# Patient Record
Sex: Female | Born: 1975 | Race: White | Hispanic: No | Marital: Married | Smoking: Former smoker
Health system: Southern US, Community
[De-identification: ages and names within clinical notes are randomized; demographics above are authoritative.]

## PROBLEM LIST (undated history)

## (undated) HISTORY — PX: WISDOM TOOTH EXTRACTION: SHX21

---

## 2008-05-01 ENCOUNTER — Ambulatory Visit: Payer: Self-pay | Admitting: *Deleted

## 2008-05-01 ENCOUNTER — Inpatient Hospital Stay (HOSPITAL_COMMUNITY): Admission: AD | Admit: 2008-05-01 | Discharge: 2008-05-07 | Payer: Self-pay | Admitting: *Deleted

## 2008-06-05 ENCOUNTER — Other Ambulatory Visit (HOSPITAL_COMMUNITY): Admission: RE | Admit: 2008-06-05 | Discharge: 2008-06-26 | Payer: Self-pay | Admitting: Psychiatry

## 2008-06-06 ENCOUNTER — Ambulatory Visit: Payer: Self-pay | Admitting: Psychiatry

## 2010-04-24 LAB — COMPREHENSIVE METABOLIC PANEL
Albumin: 3.7 g/dL (ref 3.5–5.2)
Alkaline Phosphatase: 68 U/L (ref 39–117)
BUN: 7 mg/dL (ref 6–23)
CO2: 32 mEq/L (ref 19–32)
Chloride: 103 mEq/L (ref 96–112)
Glucose, Bld: 81 mg/dL (ref 70–99)
Potassium: 3.8 mEq/L (ref 3.5–5.1)
Total Bilirubin: 0.4 mg/dL (ref 0.3–1.2)

## 2010-04-24 LAB — CBC
HCT: 36 % (ref 36.0–46.0)
Hemoglobin: 12.8 g/dL (ref 12.0–15.0)
RBC: 3.98 MIL/uL (ref 3.87–5.11)
WBC: 11.3 10*3/uL — ABNORMAL HIGH (ref 4.0–10.5)

## 2010-04-24 LAB — URINE DRUGS OF ABUSE SCREEN W ALC, ROUTINE (REF LAB)
Amphetamine Screen, Ur: NEGATIVE
Barbiturate Quant, Ur: NEGATIVE
Creatinine,U: 35.1 mg/dL
Methadone: NEGATIVE
Propoxyphene: NEGATIVE

## 2010-04-24 LAB — URINALYSIS, ROUTINE W REFLEX MICROSCOPIC
Glucose, UA: NEGATIVE mg/dL
pH: 7 (ref 5.0–8.0)

## 2010-04-24 LAB — MAGNESIUM: Magnesium: 2.1 mg/dL (ref 1.5–2.5)

## 2010-04-24 LAB — TSH: TSH: 4.044 u[IU]/mL (ref 0.350–4.500)
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
## 2010-05-28 NOTE — H&P (Signed)
NAMEJERMIYA, REICHL NO.:  [id number]   MEDICAL RECORD NO.:  [id number]          PATIENT TYPE:  IPS   LOCATION:  0306                          FACILITY:  [facility]   PHYSICIAN:  Jasmine Pang, M.D. DATE OF BIRTH:  Jan 21, 1975   DATE OF ADMISSION:  05/01/2008  DATE OF DISCHARGE:                       PSYCHIATRIC ADMISSION ASSESSMENT   This 34 year old female voluntarily admitted on May 01, 2008.  The  patient reports that she has been depressed and having suicidal  thoughts, feeling very overwhelmed, feels her medications are not  working.  She had seen her therapist and referred the patient due to her  thinking of wanting to harm herself.  The patient has stress with her  family, taking care of children, working the night shift.  She states  that she feels depressed for some other day.  Feels like she has  thoughts of sinking.  She does drink on occasion.   PAST PSYCHIATRIC HISTORY:  This her first admission to the [facility].  She has a therapist named Granville Lewis.  She was just  hospitalized at [facility] years ago.   SOCIAL HISTORY:  A 35 year old female, married.  She has four children  ages 77, 13, 5, and 3.  [place]  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   FAMILY HISTORY:  None.   ALCOHOL AND DRUG HISTORY:  Reports some drinking recently.  Denies any  other substance use.   PRIMARY CARE Gracieann Stannard:  A family nurse practitioner in [place].   MEDICAL PROBLEMS:  Reports some recent elevated blood pressure readings.  No other known medical conditions.   MEDICATIONS:  She has been on:  1. Wellbutrin.  2. Cymbalta 60 mg b.i.d.   DRUG ALLERGIES:  NO KNOWN ALLERGIES.   REVIEW OF SYSTEMS:  Significant for insomnia, increased appetite.  The  remainder of systems are noncontributory.  Denies any chest pain,  shortness of breath, nausea, vomiting, diarrhea, muscle aches,  headache,  seizures.   PHYSICAL EXAMINATION:  This is a young female who is in no acute  distress.  Her temperature is 97.8, 93 heart rate, 20 respirations, blood pressure  is 140/93.  GENERAL APPEARANCE:  This is an overweight female who is no acute  distress.  HEAD:  Atraumatic and normocephalic.  Negative lymphadenopathy.  CHEST:  Clear.  BREAST EXAM:  Deferred.  HEART:  Rate is regular rate and rhythm.  There is no murmurs or gallops  auscultated.  ABDOMEN:  Soft, nondistended abdomen.  PELVIC/GU EXAM:  Deferred.  EXTREMITIES:  Moves all extremities.  No clubbing, no edema, 5+ against  resistance.  SKIN:  Warm and dry without rashes or lacerations noted.  NEUROLOGICAL:  Findings are intact and nonfocal.   LABORATORY DATA:  Shows a TSH of 4.044.  Magnesium of 2.1.  CMET within  normal limits.  Urinalysis negative.  Pregnancy test is negative.  The  urine drug screen is negative.  WBC count is 11.3.   MENTAL STATUS EXAM:  This is a fully alert, cooperative  female, casually  dressed.  She has fair eye contact.  She appears somewhat anxious and  depressed.  She gets a teary-eyed at times.  Her thought processes and  thought content are within normal limits.  Her judgment and insight are  good and present.   AXIS I:  Mood disorder.  AXIS II:  Deferred as she has no known medical conditions.  AXIS IV:  Other psychosocial problems, possible problems with  occupation.  AXIS V:  Current is 35-40.   PLAN:  To decrease Cymbalta as that may be adding to the patient's mood  instability.  We will add Abilify to augment her antidepressant to aid  with mood stabilization.  The patient will be advised to continue with  her individual therapy.  We will consider a family session with her  husband for support concerns.  We will also address her alcohol use.  Her tentative length of stay at this time is 3-4 days.      Landry Corporal, N.P.      Jasmine Pang, M.D.   Electronically Signed    JO/MEDQ  D:  05/03/2008  T:  05/03/2008  Job:  [id number]

## 2016-08-06 ENCOUNTER — Other Ambulatory Visit: Payer: Self-pay | Admitting: Orthopedic Surgery

## 2016-08-06 DIAGNOSIS — M5412 Radiculopathy, cervical region: Secondary | ICD-10-CM

## 2016-08-13 ENCOUNTER — Encounter: Payer: Self-pay | Admitting: Radiology

## 2016-08-13 ENCOUNTER — Ambulatory Visit
Admission: RE | Admit: 2016-08-13 | Discharge: 2016-08-13 | Disposition: A | Discharge status: Home / Self Care | Payer: Managed Care, Other (non HMO) | Source: Ambulatory Visit | Attending: Orthopedic Surgery | Admitting: Orthopedic Surgery

## 2016-08-13 DIAGNOSIS — M5412 Radiculopathy, cervical region: Secondary | ICD-10-CM

## 2016-08-13 MED ORDER — TRIAMCINOLONE ACETONIDE 40 MG/ML IJ SUSP (RADIOLOGY)
60.0000 mg | Freq: Once | INTRAMUSCULAR | Status: AC
  Administered 2016-08-13: 60 mg via EPIDURAL

## 2016-08-13 MED ORDER — IOPAMIDOL (ISOVUE-M 300) INJECTION 61%
1.0000 mL | Freq: Once | INTRAMUSCULAR | Status: AC | PRN
  Administered 2016-08-13: 1 mL via EPIDURAL

## 2016-08-13 NOTE — Discharge Instructions (Signed)

## 2016-08-18 ENCOUNTER — Other Ambulatory Visit: Payer: Self-pay

## 2017-10-14 DIAGNOSIS — F4312 Post-traumatic stress disorder, chronic: Secondary | ICD-10-CM | POA: Insufficient documentation

## 2017-10-14 DIAGNOSIS — F3181 Bipolar II disorder: Secondary | ICD-10-CM

## 2017-10-19 ENCOUNTER — Encounter: Payer: Self-pay | Admitting: Psychiatry

## 2017-10-19 ENCOUNTER — Ambulatory Visit (INDEPENDENT_AMBULATORY_CARE_PROVIDER_SITE_OTHER): Payer: 59 | Admitting: Psychiatry

## 2017-10-19 VITALS — BP 137/87 | HR 78 | Ht 66.0 in

## 2017-10-19 DIAGNOSIS — F3181 Bipolar II disorder: Secondary | ICD-10-CM

## 2017-10-19 DIAGNOSIS — F5101 Primary insomnia: Secondary | ICD-10-CM

## 2017-10-19 DIAGNOSIS — F988 Other specified behavioral and emotional disorders with onset usually occurring in childhood and adolescence: Secondary | ICD-10-CM

## 2017-10-19 DIAGNOSIS — F4010 Social phobia, unspecified: Secondary | ICD-10-CM | POA: Diagnosis not present

## 2017-10-19 MED ORDER — LISDEXAMFETAMINE DIMESYLATE 60 MG PO CAPS: 60.0000 mg | ORAL_CAPSULE | ORAL | 0 refills | Status: DC

## 2017-10-19 MED ORDER — TRAZODONE 25 MG HALF TABLET: 100.0000 mg | ORAL_TABLET | Freq: Every evening | ORAL | Status: DC | PRN

## 2017-10-19 MED ORDER — OXCARBAZEPINE 300 MG PO TABS: ORAL_TABLET | ORAL | 1 refills | Status: DC

## 2017-10-19 MED ORDER — LAMOTRIGINE 200 MG PO TABS: 200.0000 mg | ORAL_TABLET | Freq: Every day | ORAL | 1 refills | Status: AC

## 2017-10-19 NOTE — Progress Notes (Signed)
Tricia Barnett [id number] 1975/02/12 42 y.o.  Subjective:   Patient ID:  Tricia Barnett is a 42 y.o. (DOB Jan 21, 1975) female.  Chief Complaint:  Chief Complaint  Patient presents with  . Anxiety  . Depression    HPI [name] presents to the office today for follow-up of anxiety, mood, and sleep disturbance. She reports that she took about a week off work to get adequate sleep. She reports that she typically does not sleep before work or immediately after work. Reports anticipatory anxiety before going into work. Reports that she has decreased caffeine use to improve sleep. Reports that her sleep issues returned after she returned to work. Working 3-4 days a week. Reports that her mood and anxiety improved after getting adequate sleep. Reports that her mood was "horrific" prior to getting adequate sleep and had SI. Reports that she has not had any recent suicidal thoughts. Mood continues to be depressed but has been improving. She reports that others would say she is irritable and she reports that her irritability is at baseline. Denies panic attacks over the last few weeks. Conitnues to avoid social situations and crowds. Was able to go out to eat with a friend recently but went very early before restaurant was crowded. Reports worry and anxious thoughts are at baseline.   Reports Trileptal does not help with sleep. Reports that she has been taking 150 mg po q am and 300 mg po QHS and she reports that this has helped with anxiety. She reports recent binge eating and that this has been increased. No change in energy or motivation. She reports that energy and motivation was very low 2 weeks ago. No change in concentration. Reports that she may want to consider changing Vyvanse in the future but not at this time since s/s are more stable.   Medications: I have reviewed the patient's current medications.  Allergies: No Known Allergies  History reviewed. No pertinent past medical  history.  Past Surgical History:  Procedure Laterality Date  . CESAREAN SECTION    . WISDOM TOOTH EXTRACTION      Family History  Problem Relation Age of Onset  . Depression Mother   . Anxiety disorder Mother   . Insulin resistance Mother   . Mood Disorder Father   . Diabetes Father   . Hypertension Father   . Kidney cancer Father   . Alcoholism Paternal Grandfather   . Mood Disorder Paternal Uncle   . Mood Disorder Paternal Aunt     Social History   Socioeconomic History  . Marital status: Married    Spouse name: Not on file  . Number of children: 4  . Years of education: Not on file  . Highest education level: Associate degree: occupational, Hotel manager, or vocational program  Occupational History  . Occupation: Nurse  Social Needs  . Financial resource strain: Not on file  . Food insecurity:    Worry: Not on file    Inability: Not on file  . Transportation needs:    Medical: Not on file    Non-medical: Not on file  Tobacco Use  . Smoking status: Former Research scientist (life sciences)  . Smokeless tobacco: Never Used  Substance and Sexual Activity  . Alcohol use: Yes    Comment: Reports that she has been drinking more vodka, about 6 out of 7 days. Reports ETOH use has been less in the last week.  . Drug use: Not Currently  . Sexual activity: Not on file  Lifestyle  .  Physical activity:    Days per week: Not on file    Minutes per session: Not on file  . Stress: Not on file  Relationships  . Social connections:    Talks on phone: Not on file    Gets together: Not on file    Attends religious service: Not on file    Active member of club or organization: Not on file    Attends meetings of clubs or organizations: Not on file    Relationship status: Not on file  . Intimate partner violence:    Fear of current or ex partner: Not on file    Emotionally abused: Not on file    Physically abused: Not on file    Forced sexual activity: Not on file  Other Topics Concern  . Not on file   Social History Narrative  . Not on file    Past Medical History, Surgical history, Social history, and Family history were reviewed and updated as appropriate.   Please see review of systems for further details on the patient's review from today.   Review of Systems:  Review of Systems  Respiratory: Negative.   Gastrointestinal: Positive for constipation.  Genitourinary: Positive for vaginal bleeding.       Reports that she has been having heavy menstrual periods.   Musculoskeletal: Negative for gait problem.  Neurological: Negative for tremors.  Psychiatric/Behavioral: Positive for decreased concentration and dysphoric mood. The patient is nervous/anxious.     Objective:   Physical Exam:  BP 137/87   Pulse 78   Ht '5\' 6"'$  (1.676 m)   Physical Exam  Constitutional: She is oriented to person, place, and time. She appears well-developed.  Musculoskeletal: She exhibits no deformity.  Neurological: She is alert and oriented to person, place, and time.  Psychiatric: Her speech is normal and behavior is normal. Judgment and thought content normal. Her mood appears anxious. Cognition and memory are normal. She exhibits a depressed mood. She expresses no homicidal and no suicidal ideation. She expresses no suicidal plans and no homicidal plans.  Pt presents as less depressed and anxious compared to recent past exams.     Lab Review:     Component Value Date/Time   NA 140 05/01/2008 2106   K 3.8 05/01/2008 2106   CL 103 05/01/2008 2106   CO2 32 05/01/2008 2106   GLUCOSE 81 05/01/2008 2106   BUN 7 05/01/2008 2106   CREATININE 0.69 05/01/2008 2106   CALCIUM 9.9 05/01/2008 2106   PROT 6.9 05/01/2008 2106   ALBUMIN 3.7 05/01/2008 2106   AST 26 05/01/2008 2106   ALT 18 05/01/2008 2106   ALKPHOS 68 05/01/2008 2106   BILITOT 0.4 05/01/2008 2106   GFRNONAA >60 05/01/2008 2106   GFRAA  05/01/2008 2106    >60        The eGFR has been calculated using the MDRD equation. This  calculation has not been validated in all clinical situations. eGFR's persistently <60 mL/min signify possible Chronic Kidney Disease.       Component Value Date/Time   WBC 11.3 (H) 05/01/2008 2106   RBC 3.98 05/01/2008 2106   HGB 12.8 05/01/2008 2106   HCT 36.0 05/01/2008 2106   PLT 291 05/01/2008 2106   MCV 90.4 05/01/2008 2106   MCHC 35.4 05/01/2008 2106   RDW 12.4 05/01/2008 2106     Assessment: Plan:   Patient seen for 30 minutes and greater than 50% of visit spent counseling patient regarding plan of care,  to include restarting trazodone as needed for insomnia since patient reports that this may have been effective in the past.  Also discussed treatment for attention deficit and patient questions if another medication besides Vyvanse may be more effective for her concentration, however she reports that she does not want to change Vyvanse at this time since overall the majority of her signs and symptoms are more stable at present.  Discussed considering a methylphenidate based stimulant in the future since patient has not taken any of these types of medications in the past and methylphenidate based medications may be less likely to exacerbate anxiety and irritability.  Recommend patient continue psychotherapy with Lina Sayre, LPC.  Patient to follow-up with this provider in 3 months or sooner if clinically indicated. Bipolar II disorder ([place]) - Plan: Oxcarbazepine (TRILEPTAL) 300 MG tablet, lamoTRIgine (LAMICTAL) 200 MG tablet  Social anxiety disorder  Attention deficit disorder (ADD) without hyperactivity - Plan: lisdexamfetamine (VYVANSE) 60 MG capsule  Primary insomnia - Plan: traZODone (DESYREL) tablet 100 mg  Please see After Visit Summary for patient specific instructions.  Future Appointments  Date Time Provider [place]  10/23/2017 10:00 AM Lina Sayre, [facility] CP-[facility] None  01/25/2018  1:00 PM Thayer Headings, PMHNP CP-CP None    No orders of the defined  types were placed in this encounter.     -------------------------------

## 2017-10-20 ENCOUNTER — Encounter: Payer: Self-pay | Admitting: Psychiatry

## 2017-10-21 ENCOUNTER — Other Ambulatory Visit: Payer: Self-pay

## 2017-10-21 ENCOUNTER — Ambulatory Visit: Payer: Self-pay | Admitting: Psychiatry

## 2017-10-21 MED ORDER — TRAZODONE HCL 100 MG PO TABS: ORAL_TABLET | ORAL | 0 refills | Status: DC

## 2017-10-23 ENCOUNTER — Ambulatory Visit (INDEPENDENT_AMBULATORY_CARE_PROVIDER_SITE_OTHER): Payer: 59 | Admitting: Psychiatry

## 2017-10-23 DIAGNOSIS — F4312 Post-traumatic stress disorder, chronic: Secondary | ICD-10-CM | POA: Diagnosis not present
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
## 2017-10-27 ENCOUNTER — Ambulatory Visit (INDEPENDENT_AMBULATORY_CARE_PROVIDER_SITE_OTHER): Payer: 59 | Admitting: Psychiatry

## 2017-10-27 DIAGNOSIS — F4312 Post-traumatic stress disorder, chronic: Secondary | ICD-10-CM | POA: Diagnosis not present

## 2017-10-27 NOTE — Progress Notes (Signed)
      [facility] Counselor/Therapist Progress Note   Patient ID: Tricia Barnett, MRN: [id number]  Date: 10/27/2017  Timespent: 45 minutes  Treatment Type: Individual  Subjective: Patient was present for session.  Patient shared that she has been struggling recently with nightmares and flashbacks.  Patient went on to explain lots of the memories have created behaviors that she is very ashamed of.  Patient wanted to share some of the recent flashbacks in session.  Patient was allowed time to do that and encouraged to remind herself currently she is safe and that she does not have to go through those traumas any longer.  Patient stated that it is hard for her to connect with the younger parts of her because they were so damaged.  Patient was encouraged just to work on the self talk and take it one step at a time but to remind herself regularly that she is enough and she is safe.  Patient did a E MDR container exercise which she is to use to put all of the memories that are disturbing her and get overwhelming.  It was agreed 1 memory at a time would be addressed in treatment at a time.  Patient also discussed being at home is making her think more so she decided she may want to pick up more shifts at work.  Patient was encouraged to try and do things that are concrete like decorating and cleaning and not let her brain wander in lots of different directions but to be engaged in concrete activity like work.  Patient agreed to work on that over the next week.  Interventions:Solution Focused, Supportive and Other: EMDR  Mental Status Exam:   Appearance:   Well Groomed     Behavior:  Appropriate  Motor:  Normal  Speech/Language:   Normal Rate  Affect:  Full Range  Mood:  anxious  Thought process:  Intact  Thought content:    Logical  Perceptual disturbances:    Normal  Orientation:  Full (Time, Place, and Person)  Attention:  Good  Concentration:  good  Memory:  Immediate  Fund of knowledge:    Good  Insight:    Good  Judgment:   Good  Impulse Control:  good    Reported Symptoms: anxiety, flashbacks, nightmares, anger, hurt  Risk Assessment: Danger to Self:  No Self-injurious Behavior: No Danger to Others: No Duty to Warn:no Physical Aggression / Violence:No  Access to Firearms a concern: No  Gang Involvement:No   Diagnosis:   ICD-10-CM   1. 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  Patient to continue to engage in individual counseling 2-4 times a month or as needed. 2.  Patient to identify and apply CBT, coping skills learned in session to decrease triggered memories and anxiety symptoms. 3.  Patient to contact this office, go to the local ED or call 911 if a crisis or emergency develops between visits.  Stevphen Meuse, [place]

## 2017-11-06 ENCOUNTER — Ambulatory Visit (INDEPENDENT_AMBULATORY_CARE_PROVIDER_SITE_OTHER): Payer: 59 | Admitting: Psychiatry

## 2017-11-06 DIAGNOSIS — F4312 Post-traumatic stress disorder, chronic: Secondary | ICD-10-CM | POA: Diagnosis not present

## 2017-11-06 DIAGNOSIS — F3181 Bipolar II disorder: Secondary | ICD-10-CM
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
## 2017-11-10 ENCOUNTER — Ambulatory Visit (INDEPENDENT_AMBULATORY_CARE_PROVIDER_SITE_OTHER): Payer: 59 | Admitting: Psychiatry

## 2017-11-10 DIAGNOSIS — F3181 Bipolar II disorder: Secondary | ICD-10-CM | POA: Diagnosis not present

## 2017-11-10 DIAGNOSIS — F4310 Post-traumatic stress disorder, unspecified: Secondary | ICD-10-CM
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
## 2017-11-19 ENCOUNTER — Ambulatory Visit (INDEPENDENT_AMBULATORY_CARE_PROVIDER_SITE_OTHER): Payer: 59 | Admitting: Psychiatry

## 2017-11-19 DIAGNOSIS — F4310 Post-traumatic stress disorder, unspecified: Secondary | ICD-10-CM

## 2017-11-19 DIAGNOSIS — F3181 Bipolar II disorder: Secondary | ICD-10-CM
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
## 2017-11-24 ENCOUNTER — Ambulatory Visit (INDEPENDENT_AMBULATORY_CARE_PROVIDER_SITE_OTHER): Payer: 59 | Admitting: Psychiatry

## 2017-11-24 DIAGNOSIS — F4310 Post-traumatic stress disorder, unspecified: Secondary | ICD-10-CM | POA: Diagnosis not present

## 2017-11-24 DIAGNOSIS — F3181 Bipolar II disorder: Secondary | ICD-10-CM
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
## 2017-11-30 ENCOUNTER — Ambulatory Visit: Payer: 59 | Admitting: Psychiatry

## 2017-11-30 DIAGNOSIS — F4312 Post-traumatic stress disorder, chronic: Secondary | ICD-10-CM | POA: Diagnosis not present

## 2017-11-30 DIAGNOSIS — F3181 Bipolar II disorder: Secondary | ICD-10-CM
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
## 2017-12-08 ENCOUNTER — Other Ambulatory Visit: Payer: Self-pay | Admitting: Psychiatry

## 2017-12-08 ENCOUNTER — Ambulatory Visit (INDEPENDENT_AMBULATORY_CARE_PROVIDER_SITE_OTHER): Payer: 59 | Admitting: Psychiatry

## 2017-12-08 DIAGNOSIS — F3181 Bipolar II disorder: Secondary | ICD-10-CM

## 2017-12-08 DIAGNOSIS — F4312 Post-traumatic stress disorder, chronic: Secondary | ICD-10-CM

## 2017-12-08 MED ORDER — OXCARBAZEPINE 300 MG PO TABS: ORAL_TABLET | ORAL | 1 refills | Status: AC

## 2017-12-08 NOTE — Progress Notes (Signed)
Please let pt or her husband know that Trileptal was resent. Recommend re-starting prior to trip since this may help prevent worsening mood, anxiety, or insomnia while traveling. It also helps to prevent seizures and ETOH withdrawal s/s. Recommend gradually re-starting it, ie. 150 mg QHS x 3 days, then 300 mg po QHS, and then 1/2 q am and 300 mg QHS

## 2017-12-08 NOTE — Progress Notes (Signed)
      [facility] Counselor/Therapist Progress Note  Patient ID: Tricia Barnett, MRN: [id number],    Date: 12/08/2017  Time Spent: 51 minutes  Treatment Type: Individual Therapy  Reported Symptoms: Depressed mood and Anxious Mood,  Mental Status Exam:  Appearance:   Well Groomed     Behavior:  Appropriate  Motor:  Normal  Speech/Language:   Normal Rate  Affect:  Appropriate  Mood:  anxious  Thought process:  circumstantial  Thought content:    WNL  Sensory/Perceptual disturbances:    WNL  Orientation:  oriented to person, place and time/date  Attention:  Fair  Concentration:  Fair  Memory:  Immediate;   Fair  [employer] of knowledge:   Good  Insight:    Fair  Judgment:   Fair  Impulse Control:  Fair   Risk Assessment: Danger to Self:  No Self-injurious Behavior: No Danger to Others: No Duty to Warn:no Physical Aggression / Violence:No  Access to Firearms a concern: No  Gang Involvement:No   Subjective: Patient was present for session.  Her husband sat in on the first part of session.  They reported they had gotten a call from school concerning their daughter and questions about her safety.  They were given a referral to contact concerning meeting with her daughter.  Patient spent time developing a plan for her to get through her trip to [place] that is coming up in a week.  Patient explained she feels very anxious and overwhelmed because many people that she does not know will be going on the trip.  Ways to start preparing herself cognitively were discussed in session.  Patient was reminded of the importance of keeping her brain engaged in positive so that it does not go in a negative direction.  Patient acknowledged her mother-in-law will be going with her which is a very positive thing.  She had a negative interaction with her mother concerning the trip.  Ways to disconnect from that situation were discussed with patient.  Patient shared that she had run out of 1 of her  medications and never  chose to restart it.  Let her know that that information will be passed on to Corie ChiquitoJessica [facility] NP her provider.  Interventions: Solution-Oriented/Positive Psychology  Diagnosis:   ICD-10-CM   1. 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 Bipolar II disorder (HCC) F31.81     Plan: 1.  Patient to continue to engage in individual counseling 2-4 times a month or as needed. 2.  Patient to identify and apply CBT, coping skills learned in session to decrease depression and anxiety symptoms. 3.  Patient to contact this office, go to the local ED or call 911 if a crisis or emergency develops between visits.  Stevphen MeuseHolly Alexsia Klindt, [place]

## 2017-12-14 ENCOUNTER — Encounter: Payer: Self-pay | Admitting: Psychiatry

## 2017-12-14 ENCOUNTER — Ambulatory Visit (INDEPENDENT_AMBULATORY_CARE_PROVIDER_SITE_OTHER): Payer: 59 | Admitting: Psychiatry

## 2017-12-14 DIAGNOSIS — F4312 Post-traumatic stress disorder, chronic: Secondary | ICD-10-CM | POA: Diagnosis not present

## 2017-12-14 DIAGNOSIS — F3181 Bipolar II disorder: Secondary | ICD-10-CM

## 2017-12-14 NOTE — Progress Notes (Signed)
      [facility] Counselor/Therapist Progress Note  Patient ID: Tricia Barnett, MRN: [id number],    Date: 12/15/2017  Time Spent: 52 minutes   Treatment Type: Individual Therapy  Reported Symptoms: Anxious Mood and Panic Attacks  Mental Status Exam:  Appearance:   Well Groomed     Behavior:  Appropriate  Motor:  Restlestness  Speech/Language:   Normal Rate  Affect:  Congruent  Mood:  anxious  Thought process:  circumstantial  Thought content:    WNL  Sensory/Perceptual disturbances:    WNL  Orientation:  oriented to person, place and time/date  Attention:  Good  Concentration:  Good  Memory:  WNL  Fund of knowledge:   Good  Insight:    Fair  Judgment:   Fair  Impulse Control:  Fair   Risk Assessment: Danger to Self:  No Self-injurious Behavior: No Danger to Others: No Duty to Warn:no Physical Aggression / Violence:No  Access to Firearms a concern: No  Gang Involvement:No   Subjective: Patient was present for session.  She reported feeling lots of anxiety over her trip.  She explained she is finding herself obsessing of her little details.  Patient was encouraged to think through ways to keep her self at a good place during the trip.  She was reminded that thoughts lead to feelings lead to behaviors so she has to focus on her thoughts.  Patient was encouraged to recognize things that trigger her.  Discussed the importance of using grounding techniques basis to make sure she functioning appropriately.  Different strategies to engage her brain and concrete things were discussed with patient as developed.  Patient was also able to think through safe people that will be on the trip with her to spend time with so she can feel more comfortable.  Patient agreed to take all of her medication as directed on her trip as well.  Patient reported feeling good about the plans and end of session.  Interventions: Solution-Oriented/Positive Psychology  Diagnosis:   ICD-10-CM   1.  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 Bipolar II disorder (HCC) F31.81     Plan: 1.  Patient to continue to engage in individual counseling 2-4 times a month or as needed. 2.  Patient to identify and apply CBT, coping skills learned in treatment to decrease triggered responses and anxiety symptoms. 3.  Patient to contact this office, go to the local ED or call 911 if a crisis or emergency develops between visits.  Stevphen MeuseHolly Phillippa Straub, [place]

## 2018-01-20 ENCOUNTER — Ambulatory Visit: Payer: 59 | Admitting: Psychiatry

## 2018-01-25 ENCOUNTER — Encounter: Payer: Self-pay | Admitting: Psychiatry

## 2018-01-25 ENCOUNTER — Ambulatory Visit (INDEPENDENT_AMBULATORY_CARE_PROVIDER_SITE_OTHER): Payer: 59 | Admitting: Psychiatry

## 2018-01-25 VITALS — BP 121/83 | HR 59

## 2018-01-25 DIAGNOSIS — F4310 Post-traumatic stress disorder, unspecified: Secondary | ICD-10-CM

## 2018-01-25 DIAGNOSIS — F5101 Primary insomnia: Secondary | ICD-10-CM | POA: Diagnosis not present

## 2018-01-25 DIAGNOSIS — F988 Other specified behavioral and emotional disorders with onset usually occurring in childhood and adolescence: Secondary | ICD-10-CM

## 2018-01-25 DIAGNOSIS — F4010 Social phobia, unspecified: Secondary | ICD-10-CM

## 2018-01-25 MED ORDER — TRAZODONE HCL 100 MG PO TABS: ORAL_TABLET | ORAL | 1 refills | Status: AC

## 2018-01-25 MED ORDER — LISDEXAMFETAMINE DIMESYLATE 60 MG PO CAPS: 60.0000 mg | ORAL_CAPSULE | ORAL | 0 refills | Status: DC

## 2018-01-25 MED ORDER — LISDEXAMFETAMINE DIMESYLATE 60 MG PO CAPS: 60.0000 mg | ORAL_CAPSULE | Freq: Every morning | ORAL | 0 refills | Status: AC

## 2018-01-25 MED ORDER — LISDEXAMFETAMINE DIMESYLATE 60 MG PO CAPS: 60.0000 mg | ORAL_CAPSULE | ORAL | 0 refills | Status: AC

## 2018-01-25 NOTE — Progress Notes (Signed)
Tricia Barnett [id number] 1975-10-18 43 y.o.  Subjective:   Patient ID:  Tricia Barnett is a 43 y.o. (DOB 03-05-1975) female.  Chief Complaint:  Chief Complaint  Patient presents with  . Follow-up    h/o Anxiety, Depression, ADD, Sleep disturbance    HPI Tricia Barnett presents to the office today for follow-up of Mood, anxiety, and insomnia. She is accompanied by her husband. She reports "I'm good." She and her husband report that her mood and anxiety is the best it has been in years. She reports that trip to [place] was helpful and she has a different perspective and has grown in her faith. Has not had ETOH in 28 days. She denies any physical cravings for ETOH. Reports occasional psychological cravings. Reports that her mood has been more stable with occasional mild depression. Denies any impulsive or risky behavior. Reports that she continues to experience some anxiety. Some social anxiety and worries about what people think about her. Had severe anxiety on Saturday in anticipation of going to a gathering with the in-laws. 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 Reports that her sleep is "ok" and that Trazodone is effective. Reports that at times she will not take Trazodone if she feels like can fall asleep without it. Appetite has been "ok." Reports that she has had some increase in binge eating since she stopped drinking. She reports that concentration is "not that great but I know Vyvanse helps because I had a few days without it" and that it is severely impaired when she does not take Vyvanse. Energy and motivation have ben good. Denies SI. Reports vague thoughts/fears about what could happen if she drank excessively.    Review of Systems:  Review of Systems  Musculoskeletal: Negative for gait problem.  Neurological: Negative for tremors.  Psychiatric/Behavioral:       Please refer to HPI    Medications: I have reviewed the patient's current  medications.  Current Outpatient Medications  Medication Sig Dispense Refill  . ibuprofen (ADVIL,MOTRIN) 400 MG tablet Take 400 mg by mouth every 6 (six) hours as needed.    Derrill Memo ON 02/22/2018] lisdexamfetamine (VYVANSE) 60 MG capsule Take 1 capsule (60 mg total) by mouth every morning. 30 capsule 0  . lisdexamfetamine (VYVANSE) 60 MG capsule Take 1 capsule (60 mg total) by mouth every morning for 30 days. 30 capsule 0  . propranolol (INDERAL) 10 MG tablet Take 10 mg by mouth 2 times daily at 12 noon and 4 pm.    . traZODone (DESYREL) 100 MG tablet Take 1-2 tablets at bedtime as needed for sleep 180 tablet 1  . lamoTRIgine (LAMICTAL) 200 MG tablet Take 200 mg by mouth daily.    Marland Kitchen lamoTRIgine (LAMICTAL) 200 MG tablet Take 1 tablet (200 mg total) by mouth daily. (Patient not taking: Reported on 01/25/2018) 90 tablet 1  . [START ON 03/22/2018] lisdexamfetamine (VYVANSE) 60 MG capsule Take 1 capsule (60 mg total) by mouth every morning. 30 capsule 0  . Oxcarbazepine (TRILEPTAL) 300 MG tablet Take 300 mg by mouth at bedtime. 0.5 TAB PO Q AM AND 1 TAB AT BEDTIME    . Oxcarbazepine (TRILEPTAL) 300 MG tablet Take 1/2 tab po q am and 1 tab po qhs (Patient not taking: Reported on 01/25/2018) 135 tablet 1  . pramipexole (MIRAPEX) 0.5 MG tablet Take 0.5 mg by mouth 2 (two) times daily.     No current facility-administered medications for this visit.  Medication Side Effects: None  Allergies: No Known Allergies  History reviewed. No pertinent past medical history.  Family History  Problem Relation Age of Onset  . Depression Mother   . Anxiety disorder Mother   . Insulin resistance Mother   . Mood Disorder Father   . Diabetes Father   . Hypertension Father   . Kidney cancer Father   . Alcoholism Paternal Grandfather   . Mood Disorder Paternal Uncle   . Mood Disorder Paternal Aunt     Social History   Socioeconomic History  . Marital status: Married    Spouse name: Not on file  .  Number of children: 4  . Years of education: Not on file  . Highest education level: Associate degree: occupational, Hotel manager, or vocational program  Occupational History  . Occupation: Nurse  Social Needs  . Financial resource strain: Not on file  . Food insecurity:    Worry: Not on file    Inability: Not on file  . Transportation needs:    Medical: Not on file    Non-medical: Not on file  Tobacco Use  . Smoking status: Former Research scientist (life sciences)  . Smokeless tobacco: Never Used  Substance and Sexual Activity  . Alcohol use: Yes    Comment: Reports that she has been drinking more vodka, about 6 out of 7 days. Reports ETOH use has been less in the last week.  . Drug use: Not Currently  . Sexual activity: Not on file  Lifestyle  . Physical activity:    Days per week: Not on file    Minutes per session: Not on file  . Stress: Not on file  Relationships  . Social connections:    Talks on phone: Not on file    Gets together: Not on file    Attends religious service: Not on file    Active member of club or organization: Not on file    Attends meetings of clubs or organizations: Not on file    Relationship status: Not on file  . Intimate partner violence:    Fear of current or ex partner: Not on file    Emotionally abused: Not on file    Physically abused: Not on file    Forced sexual activity: Not on file  Other Topics Concern  . Not on file  Social History Narrative  . Not on file    Past Medical History, Surgical history, Social history, and Family history were reviewed and updated as appropriate.   Please see review of systems for further details on the patient's review from today.   Objective:   Physical Exam:  BP 121/83   Pulse (!) 59   Physical Exam Constitutional:      General: She is not in acute distress.    Appearance: She is well-developed.  Musculoskeletal:        General: No deformity.  Neurological:     Mental Status: She is alert and oriented to person,  place, and time.     Coordination: Coordination normal.  Psychiatric:        Mood and Affect: Mood is anxious. Mood is not depressed. Affect is not labile, blunt, angry or inappropriate.        Speech: Speech normal.        Behavior: Behavior normal.        Thought Content: Thought content normal. Thought content does not include homicidal or suicidal ideation. Thought content does not include homicidal or suicidal plan.  Judgment: Judgment normal.     Comments:  Presents as less anxious and less depressed compared to previous exams. Insight intact. No auditory or visual hallucinations. No delusions.      Lab Review:     Component Value Date/Time   NA 140 05/01/2008 2106   K 3.8 05/01/2008 2106   CL 103 05/01/2008 2106   CO2 32 05/01/2008 2106   GLUCOSE 81 05/01/2008 2106   BUN 7 05/01/2008 2106   CREATININE 0.69 05/01/2008 2106   CALCIUM 9.9 05/01/2008 2106   PROT 6.9 05/01/2008 2106   ALBUMIN 3.7 05/01/2008 2106   AST 26 05/01/2008 2106   ALT 18 05/01/2008 2106   ALKPHOS 68 05/01/2008 2106   BILITOT 0.4 05/01/2008 2106   GFRNONAA >60 05/01/2008 2106   GFRAA  05/01/2008 2106    >60        The eGFR has been calculated using the MDRD equation. This calculation has not been validated in all clinical situations. eGFR's persistently <60 mL/min signify possible Chronic Kidney Disease.       Component Value Date/Time   WBC 11.3 (H) 05/01/2008 2106   RBC 3.98 05/01/2008 2106   HGB 12.8 05/01/2008 2106   HCT 36.0 05/01/2008 2106   PLT 291 05/01/2008 2106   MCV 90.4 05/01/2008 2106   MCHC 35.4 05/01/2008 2106   RDW 12.4 05/01/2008 2106    No results found for: POCLITH, LITHIUM   No results found for: PHENYTOIN, PHENOBARB, VALPROATE, CBMZ   .res Assessment: Plan:   Will continue current plan of care patient reports that mood and anxiety signs and symptoms are currently well controlled.  Recommended patient contact office if she experiences a recurrence in  mood and/or anxiety signs and symptoms and previous medications to be restarted. We will continue Vyvanse for attention deficit. We will continue trazodone for insomnia. Recommend continuing to see Lina Sayre, [facility] for therapy.  PTSD (post-traumatic stress disorder)  Social anxiety disorder  Attention deficit disorder (ADD) without hyperactivity - Plan: lisdexamfetamine (VYVANSE) 60 MG capsule, lisdexamfetamine (VYVANSE) 60 MG capsule, lisdexamfetamine (VYVANSE) 60 MG capsule  Primary insomnia - Plan: traZODone (DESYREL) 100 MG tablet  Please see After Visit Summary for patient specific instructions.  Future Appointments  Date Time Provider [place]  03/01/2018 11:00 AM Lina Sayre, [place] [facility] None  04/27/2018 11:45 AM Thayer Headings, [facility] None    No orders of the defined types were placed in this encounter.     -------------------------------

## 2018-02-01 ENCOUNTER — Ambulatory Visit: Payer: 59 | Admitting: Psychiatry

## 2018-03-01 ENCOUNTER — Ambulatory Visit: Payer: 59 | Admitting: Psychiatry

## 2018-03-19 ENCOUNTER — Telehealth: Payer: Self-pay | Admitting: Psychiatry

## 2018-03-19 DIAGNOSIS — F988 Other specified behavioral and emotional disorders with onset usually occurring in childhood and adolescence: Secondary | ICD-10-CM

## 2018-03-19 MED ORDER — LISDEXAMFETAMINE DIMESYLATE 60 MG PO CAPS: 60.0000 mg | ORAL_CAPSULE | ORAL | 0 refills | Status: AC

## 2018-03-19 NOTE — Telephone Encounter (Signed)
Called and spoke with pt.'s Husband Adam. He checked with the pharmacy and they do have Lamiah's Rx ready. He said thank you for getting this fixed and will call back if there are any further complications with it.

## 2018-03-19 NOTE — Telephone Encounter (Signed)
He called back . Her last RF of Vyvanse was filled at [facility] on 02/13/2018. Next Vyvanse cant fill until 3/9? Any way to try to get that for filling today. She will need it for this weekend.

## 2018-03-19 NOTE — Telephone Encounter (Signed)
Spouse, Adam, called asking for a return message.  He just left a vm so I don't know any details.  No appt scheduled because we cancelled 4/14 due to Shanda Bumps being out of the office that day

## 2018-04-27 ENCOUNTER — Ambulatory Visit: Payer: 59 | Admitting: Psychiatry

## 2018-06-27 IMAGING — XA DG INJECT/[PERSON_NAME] INC NEEDLE/CATH/PLC EPI/CERV/THOR W/IMG
2 series · 2 of 2 positions shown · non-contrast
Comparison: none

CLINICAL DATA: Spondylosis C5-6 and C6-7. Left shoulder and arm
radicular symptoms. No myelopathy.

[Series 1: ortho standard · 1 of 1 slices shown (1 of 2)]
[im 1/1]
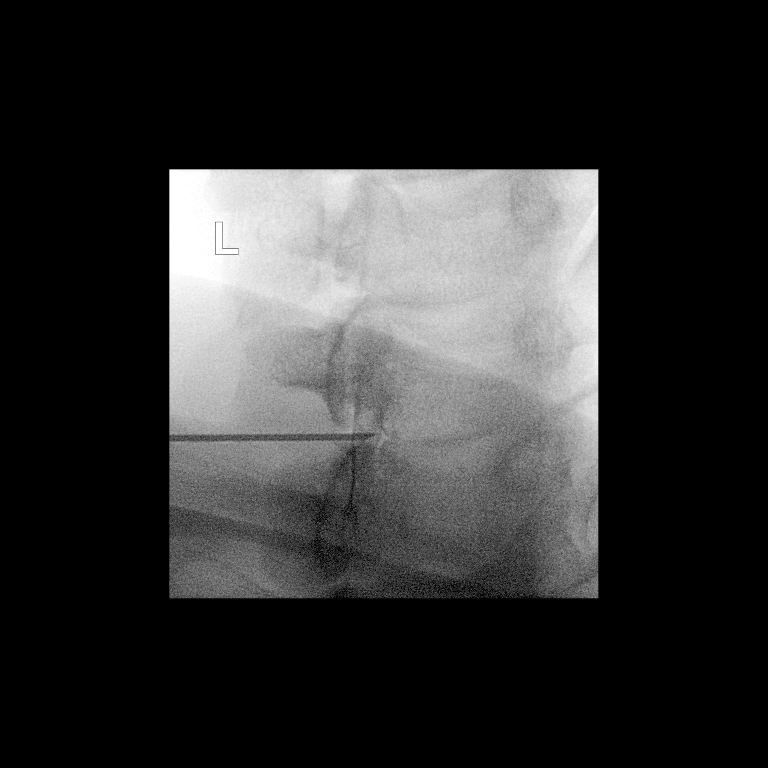

[Series 2: ortho standard · 1 of 1 slices shown (2 of 2)]
[im 1/1]
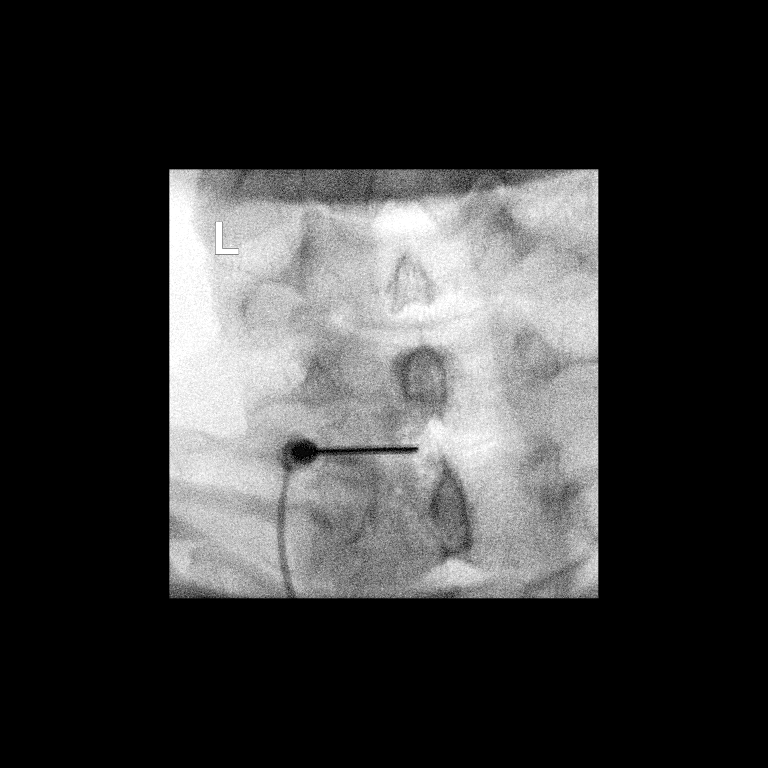

[2 of 2 positions shown; findings below may reference images not displayed]

FLUOROSCOPY TIME:  0 minutes 26 seconds. 8.27 micro gray meter
squared

PROCEDURE:
CERVICAL EPIDURAL INJECTION

An interlaminar approach was performed on the left at C7-T1 . A 20
gauge epidural needle was advanced using loss-of-resistance
technique.

DIAGNOSTIC EPIDURAL INJECTION

Injection of Isovue-M 300 shows a good epidural pattern with spread
above and below the level of needle placement, primarily on the
left. No vascular opacification is seen. THERAPEUTIC

EPIDURAL INJECTION

1.5 ml of Kenalog 40 mixed with 1 ml of 1% Lidocaine and 2 ml of
normal saline were then instilled. The procedure was well-tolerated,
and the patient was discharged thirty minutes following the
injection in good condition.
IMPRESSION: Technically successful initial epidural injection on the left at
C7-T1.

## 2020-07-09 ENCOUNTER — Other Ambulatory Visit: Payer: Self-pay | Admitting: Orthopedic Surgery

## 2020-07-09 DIAGNOSIS — M542 Cervicalgia: Secondary | ICD-10-CM

## 2020-07-17 ENCOUNTER — Other Ambulatory Visit: Payer: Self-pay

## 2020-07-17 ENCOUNTER — Ambulatory Visit
Admission: RE | Admit: 2020-07-17 | Discharge: 2020-07-17 | Disposition: A | Discharge status: Home / Self Care | Payer: Managed Care, Other (non HMO) | Source: Ambulatory Visit | Attending: Orthopedic Surgery | Admitting: Orthopedic Surgery

## 2020-07-17 DIAGNOSIS — M542 Cervicalgia: Secondary | ICD-10-CM

## 2020-07-17 MED ORDER — TRIAMCINOLONE ACETONIDE 40 MG/ML IJ SUSP (RADIOLOGY)
60.0000 mg | Freq: Once | INTRAMUSCULAR | Status: AC
  Administered 2020-07-17: 60 mg via EPIDURAL

## 2020-07-17 MED ORDER — IOPAMIDOL (ISOVUE-M 300) INJECTION 61%
1.0000 mL | Freq: Once | INTRAMUSCULAR | Status: AC | PRN
  Administered 2020-07-17: 1 mL via EPIDURAL

## 2020-07-17 NOTE — Discharge Instructions (Signed)
